# Patient Record
Sex: Male | Born: 1990 | Race: White | Hispanic: No | Marital: Single | Smoking: Never smoker
Health system: Southern US, Community
[De-identification: ages and names within clinical notes are randomized; demographics above are authoritative.]

## PROBLEM LIST (undated history)

## (undated) DIAGNOSIS — F840 Autistic disorder: Secondary | ICD-10-CM

---

## 2020-02-28 ENCOUNTER — Emergency Department (HOSPITAL_COMMUNITY)
Admission: EM | Admit: 2020-02-28 | Discharge: 2020-02-28 | Disposition: A | Discharge status: Home / Self Care | Payer: Medicaid Other | Attending: Emergency Medicine | Admitting: Emergency Medicine

## 2020-02-28 ENCOUNTER — Other Ambulatory Visit: Payer: Self-pay

## 2020-02-28 ENCOUNTER — Encounter (HOSPITAL_COMMUNITY): Payer: Self-pay

## 2020-02-28 ENCOUNTER — Emergency Department (HOSPITAL_COMMUNITY): Payer: Medicaid Other

## 2020-02-28 DIAGNOSIS — Y999 Unspecified external cause status: Secondary | ICD-10-CM | POA: Diagnosis not present

## 2020-02-28 DIAGNOSIS — Y92199 Unspecified place in other specified residential institution as the place of occurrence of the external cause: Secondary | ICD-10-CM | POA: Diagnosis not present

## 2020-02-28 DIAGNOSIS — W19XXXA Unspecified fall, initial encounter: Secondary | ICD-10-CM | POA: Diagnosis not present

## 2020-02-28 DIAGNOSIS — R519 Headache, unspecified: Secondary | ICD-10-CM | POA: Insufficient documentation

## 2020-02-28 DIAGNOSIS — Z23 Encounter for immunization: Secondary | ICD-10-CM | POA: Diagnosis not present

## 2020-02-28 DIAGNOSIS — S0181XA Laceration without foreign body of other part of head, initial encounter: Secondary | ICD-10-CM

## 2020-02-28 DIAGNOSIS — F840 Autistic disorder: Secondary | ICD-10-CM | POA: Insufficient documentation

## 2020-02-28 DIAGNOSIS — Y939 Activity, unspecified: Secondary | ICD-10-CM | POA: Insufficient documentation

## 2020-02-28 DIAGNOSIS — S01111A Laceration without foreign body of right eyelid and periocular area, initial encounter: Secondary | ICD-10-CM | POA: Insufficient documentation

## 2020-02-28 HISTORY — DX: Autistic disorder: F84.0

## 2020-02-28 MED ORDER — TETANUS-DIPHTH-ACELL PERTUSSIS 5-2.5-18.5 LF-MCG/0.5 IM SUSP
0.5000 mL | Freq: Once | INTRAMUSCULAR | Status: AC
  Administered 2020-02-28: 0.5 mL via INTRAMUSCULAR
  Filled 2020-02-28: qty 0.5

## 2020-02-28 NOTE — ED Notes (Signed)
Rt eyebrow laceration closed with Dermabond. Pt tol well

## 2020-02-28 NOTE — Discharge Instructions (Signed)
Please follow up with your primary care provider within 5-7 days for re-evaluation of your symptoms. If you do not have a primary care provider, information for a healthcare clinic has been provided for you to make arrangements for follow up care. Please return to the emergency department for any new or worsening symptoms. ° °

## 2020-02-28 NOTE — ED Triage Notes (Signed)
Patient is from a group home and staff member is with the patient.  Patient has a laceration to the right eyebrow area. Patient states he fell. Bleeding controlled.

## 2020-02-28 NOTE — ED Provider Notes (Signed)
[facility] Provider Note   CSN: [id number] Arrival date & time: 02/28/20  1556     History Chief Complaint  Patient presents with  . Laceration    Joseph Dilling Gorsline Montez Hageman. is a 29 y.o. male.  HPI   29 year old male with history of autism presenting for evaluation of fall.  He is here with his caregiver.  He lives in a group home.  He reportedly fell earlier today and sustained a cut to the right lateral eyebrow.  He is unsure how he fell.  He has some lower back pain but otherwise does not have any complaints.  The caregiver is not sure if his Tdap is up-to-date.  She denies that he has had any other complaints.  Past Medical History:  Diagnosis Date  . Autism     There are no problems to display for this patient.   History reviewed. No pertinent surgical history.     History reviewed. No pertinent family history.  Social History   Tobacco Use  . Smoking status: Never Smoker  . Smokeless tobacco: Never Used  Substance Use Topics  . Alcohol use: Never  . Drug use: Never    Home Medications Prior to Admission medications   Not on File    Allergies    Patient has no known allergies.  Review of Systems   Review of Systems  Constitutional: Negative for fever.  Eyes: Negative for photophobia, pain, redness and visual disturbance.  Respiratory: Negative for shortness of breath.   Gastrointestinal: Negative for abdominal pain, nausea and vomiting.  Genitourinary: Negative for flank pain.  Musculoskeletal: Positive for back pain. Negative for neck pain.  Skin: Positive for wound.  Neurological: Negative for weakness, numbness and headaches.       Head injury, no headache    Physical Exam Updated Vital Signs BP 111/71 (BP Location: Right Arm)   Pulse 63   Temp 97.8 F (36.6 C) (Oral)   Resp 16   Ht 5\' 7"  (1.702 m)   Wt 59 kg   SpO2 100%   BMI 20.36 kg/m   Physical Exam Vitals and nursing note reviewed.    Constitutional:      Appearance: He is well-developed.  HENT:     Head: Normocephalic.     Comments: 1cm laceration to the right eyebrow that is mildly ttp.  Eyes:     Extraocular Movements: Extraocular movements intact.     Conjunctiva/sclera: Conjunctivae normal.     Pupils: Pupils are equal, round, and reactive to light.     Comments: No nystagmus  Cardiovascular:     Rate and Rhythm: Normal rate and regular rhythm.     Heart sounds: No murmur.  Pulmonary:     Effort: Pulmonary effort is normal. No respiratory distress.     Breath sounds: Normal breath sounds.  Abdominal:     Palpations: Abdomen is soft.     Tenderness: There is no abdominal tenderness.  Musculoskeletal:     Cervical back: Neck supple.  Skin:    General: Skin is warm and dry.  Neurological:     Mental Status: He is alert.     Comments: Mental Status:  Alert, thought content appropriate, able to give a coherent history. Speech fluent without evidence of aphasia. Able to follow 2 step commands without difficulty.  Cranial Nerves:  II:  pupils equal, round, reactive to light III,IV, VI: ptosis not present, extra-ocular motions intact bilaterally  V,VII: smile symmetric, facial light  touch sensation equal VIII: hearing grossly normal to voice  X: uvula elevates symmetrically  XI: bilateral shoulder shrug symmetric and strong XII: midline tongue extension without fassiculations Motor:  Normal tone. 5/5 strength of BUE and BLE major muscle groups including strong and equal grip strength and dorsiflexion/plantar flexion Sensory: light touch normal in all extremities. Gait: normal gait and balance.      ED Results / Procedures / Treatments   Labs (all labs ordered are listed, but only abnormal results are displayed) Labs Reviewed - No data to display  EKG None  Radiology DG Lumbar Spine Complete  Result Date: 02/28/2020 CLINICAL DATA:  Fall EXAM: LUMBAR SPINE - COMPLETE 4+ VIEW COMPARISON:  None.  FINDINGS: There is no evidence of lumbar spine fracture. Alignment is normal. Intervertebral disc spaces are maintained. IMPRESSION: No fracture or dislocation of the lumbar spine Electronically Signed   By: Lauralyn Primes M.D.   On: 02/28/2020 18:09   CT Head Wo Contrast  Result Date: 02/28/2020 CLINICAL DATA:  Fall, headache EXAM: CT HEAD WITHOUT CONTRAST TECHNIQUE: Contiguous axial images were obtained from the base of the skull through the vertex without intravenous contrast. COMPARISON:  None. FINDINGS: Brain: No evidence of acute infarction, hemorrhage, hydrocephalus, extra-axial collection or mass lesion/mass effect. Vascular: No hyperdense vessel or unexpected calcification. Skull: Normal. Negative for fracture or focal lesion. Sinuses/Orbits: No acute finding. Other: None. IMPRESSION: No acute intracranial pathology. Electronically Signed   By: Lauralyn Primes M.D.   On: 02/28/2020 18:13    Procedures .Marland KitchenLaceration Repair  Date/Time: 02/28/2020 7:29 PM Performed by: Joseph Meres, PA-C Authorized by: Joseph Meres, PA-C   Consent:    Consent obtained:  Verbal   Consent given by:  Patient and guardian   Risks discussed:  Pain, infection and poor cosmetic result   Alternatives discussed:  No treatment Anesthesia (see MAR for exact dosages):    Anesthesia method:  None Laceration details:    Location: right lateral eyebrow.   Length (cm):  1 Repair type:    Repair type:  Simple Pre-procedure details:    Preparation:  Patient was prepped and draped in usual sterile fashion Exploration:    Hemostasis achieved with:  Direct pressure   Wound exploration: wound explored through full range of motion and entire depth of wound probed and visualized     Contaminated: no   Treatment:    Area cleansed with:  Saline   Amount of cleaning:  Standard   Irrigation solution:  Sterile saline   Visualized foreign bodies/material removed: no   Skin repair:    Repair method:  Tissue  adhesive Approximation:    Approximation:  Close Post-procedure details:    Dressing:  Open (no dressing)   Patient tolerance of procedure:  Tolerated well, no immediate complications   (including critical care time)  Medications Ordered in ED Medications  Tdap (BOOSTRIX) injection 0.5 mL (0.5 mLs Intramuscular Given 02/28/20 1741)    ED Course  I have reviewed the triage vital signs and the nursing notes.  Pertinent labs & imaging results that were available during my care of the patient were reviewed by me and considered in my medical decision making (see chart for details).    MDM Rules/Calculators/A&P                      Pressure irrigation performed. Wound explored and base of wound visualized in a bloodless field without evidence of foreign body.  Laceration occurred <  8 hours prior to repair which was well tolerated.Tdap updated.  Pt has  no comorbidities to effect normal wound healing. Pt discharged  without antibiotics.  CT head obtained and was without evidence of intracranial bleeding or skull fx. Xray lumbar spine without evidence of fracture. Advised pcp f/u; they are to return to the ED sooner for signs of infection. Pt is hemodynamically stable with no complaints prior to dc.    Final Clinical Impression(s) / ED Diagnoses Final diagnoses:  Facial laceration, initial encounter  Fall, initial encounter    Rx / DC Orders ED Discharge Orders    None       Bishop Dublin 02/28/20 1931    Fredia Sorrow, MD 03/09/20 [id number]

## 2020-04-27 ENCOUNTER — Other Ambulatory Visit: Payer: Self-pay

## 2020-04-27 ENCOUNTER — Emergency Department (HOSPITAL_COMMUNITY)
Admission: EM | Admit: 2020-04-27 | Discharge: 2020-04-27 | Disposition: A | Discharge status: Home / Self Care | Payer: Medicaid Other | Attending: Emergency Medicine | Admitting: Emergency Medicine

## 2020-04-27 ENCOUNTER — Encounter (HOSPITAL_COMMUNITY): Payer: Self-pay

## 2020-04-27 DIAGNOSIS — S0181XA Laceration without foreign body of other part of head, initial encounter: Secondary | ICD-10-CM | POA: Diagnosis not present

## 2020-04-27 DIAGNOSIS — Y9389 Activity, other specified: Secondary | ICD-10-CM | POA: Insufficient documentation

## 2020-04-27 DIAGNOSIS — W1830XA Fall on same level, unspecified, initial encounter: Secondary | ICD-10-CM | POA: Insufficient documentation

## 2020-04-27 DIAGNOSIS — Y998 Other external cause status: Secondary | ICD-10-CM | POA: Diagnosis not present

## 2020-04-27 DIAGNOSIS — Y92009 Unspecified place in unspecified non-institutional (private) residence as the place of occurrence of the external cause: Secondary | ICD-10-CM | POA: Diagnosis not present

## 2020-04-27 MED ORDER — LIDOCAINE HCL (PF) 1 % IJ SOLN
30.0000 mL | Freq: Once | INTRAMUSCULAR | Status: AC
  Administered 2020-04-27: 30 mL
  Filled 2020-04-27: qty 30

## 2020-04-27 MED ORDER — LIDOCAINE-EPINEPHRINE-TETRACAINE (LET) TOPICAL GEL
3.0000 mL | Freq: Once | TOPICAL | Status: AC
  Administered 2020-04-27: 3 mL via TOPICAL
  Filled 2020-04-27: qty 3

## 2020-04-27 NOTE — ED Notes (Signed)
Discharge instructions reviewed with caregiver from group home. Caregiver will be taking patient home. Pt and caregiver have no further questions at this time.

## 2020-04-27 NOTE — ED Provider Notes (Signed)
[facility] DEPT Provider Note   CSN: [id number] Arrival date & time: 04/27/20  7902     History Chief Complaint  Patient presents with  . Fall    Joseph Knight. is a 29 y.o. male with a past medical history of autism presenting to the ED after a fall that occurred prior to arrival. Patient unsure of circumstances regarding his fall. He reports pain only at the site of his chin laceration. He is unsure of last tetanus shot.  HPI     Past Medical History:  Diagnosis Date  . Autism     There are no problems to display for this patient.   History reviewed. No pertinent surgical history.     No family history on file.  Social History   Tobacco Use  . Smoking status: Never Smoker  . Smokeless tobacco: Never Used  Vaping Use  . Vaping Use: Never used  Substance Use Topics  . Alcohol use: Never  . Drug use: Never    Home Medications Prior to Admission medications   Not on File    Allergies    Patient has no known allergies.  Review of Systems   Review of Systems  Unable to perform ROS: Other (autism)  Eyes: Negative for visual disturbance.  Skin: Positive for wound.  Neurological: Negative for headaches.    Physical Exam Updated Vital Signs BP (!) 151/91 (BP Location: Right Arm)   Pulse 96   Temp 97.9 F (36.6 C) (Oral)   Resp 17   SpO2 100%   Physical Exam Vitals and nursing note reviewed.  Constitutional:      General: He is not in acute distress.    Appearance: He is well-developed.  HENT:     Head: Normocephalic and atraumatic.     Nose: Nose normal.  Eyes:     General: No scleral icterus.       Right eye: No discharge.        Left eye: No discharge.     Conjunctiva/sclera: Conjunctivae normal.     Pupils: Pupils are equal, round, and reactive to light.  Cardiovascular:     Rate and Rhythm: Normal rate and regular rhythm.     Heart sounds: Normal heart sounds. No murmur heard.  No friction rub.  No gallop.   Pulmonary:     Effort: Pulmonary effort is normal. No respiratory distress.     Breath sounds: Normal breath sounds.  Abdominal:     General: Bowel sounds are normal. There is no distension.     Palpations: Abdomen is soft.     Tenderness: There is no abdominal tenderness. There is no guarding.  Musculoskeletal:        General: Normal range of motion.     Cervical back: Normal range of motion and neck supple.  Skin:    General: Skin is warm and dry.     Findings: Laceration present. No rash.     Comments: 1in laceration to R chin that is somewhat gaping. This does not appear to be through and through. No internal mouth trauma noted.  Neurological:     Mental Status: He is alert.     Motor: No abnormal muscle tone.     Coordination: Coordination normal.     Comments: No facial asymmetry noted.  Equal grip strength bilaterally.     ED Results / Procedures / Treatments   Labs (all labs ordered are listed, but only abnormal results are displayed)  Labs Reviewed - No data to display  EKG None  Radiology No results found.  Procedures Procedures (including critical care time)  Medications Ordered in ED Medications  lidocaine (PF) (XYLOCAINE) 1 % injection 30 mL (30 mLs Infiltration Given 04/27/20 0739)  lidocaine-EPINEPHrine-tetracaine (LET) topical gel (3 mLs Topical Given 04/27/20 0739)    ED Course  I have reviewed the triage vital signs and the nursing notes.  Pertinent labs & imaging results that were available during my care of the patient were reviewed by me and considered in my medical decision making (see chart for details).  Clinical Course as of Apr 27 833  Mon Apr 27, 2020  8657 Attempted to contact patient's father listed as emergency contact, without success.   [HK]  0739 Attempted to call group home. They state everyone that knew about the fall went home "because they were on third shift."   [HK]    Clinical Course User Index [HK] Dietrich Pates, PA-C   MDM Rules/Calculators/A&P                          29 year old male with a past medical history of autism presenting to the ED for chin laceration after fall that occurred prior to arrival.  Posey Rea of circumstances surrounding his fall but he appears to be at his baseline.  Wound explored and base of wound visualized in a bloodless field without evidence of foreign body.  Chart review shows that Tdap was updated several weeks ago. Patient counseled on wound care. Patient counseled on need to return or see PCP/urgent care for suture removal in 5 days. Patient was urged to return to the Emergency Department urgently with worsening pain, swelling, expanding erythema especially if it streaks away from the affected area, fever, or if they have any other concerns. Patient verbalized understanding.    Patient is hemodynamically stable, in NAD, and able to ambulate in the ED. Evaluation does not show pathology that would require ongoing emergent intervention or inpatient treatment. I explained the diagnosis to the patient. Pain has been managed and has no complaints prior to discharge. Patient is comfortable with above plan and is stable for discharge at this time. All questions were answered prior to disposition. Strict return precautions for returning to the ED were discussed. Encouraged follow up with PCP.   An After Visit Summary was printed and given to the patient.   Portions of this note were generated with Scientist, clinical (histocompatibility and immunogenetics). Dictation errors may occur despite best attempts at proofreading.   Final Clinical Impression(s) / ED Diagnoses Final diagnoses:  Facial laceration, initial encounter    Rx / DC Orders ED Discharge Orders    None       Dietrich Pates, PA-C 04/27/20 8469    Tegeler, Canary Brim, MD 04/27/20 [phone]

## 2020-04-27 NOTE — ED Triage Notes (Signed)
Patient arrived due to a fall today with an inch laceration on his chin. No known LOC. Patient lives at group home [facility] [phone]) with minimal information given to EMS.

## 2020-04-27 NOTE — Discharge Instructions (Addendum)
Follow instructions regarding wound care. You will need to return in about 5 days for suture removal. Follow-up with your primary care provider. Return to the ER for signs of infection including redness, swelling, pus draining from the area or fevers.

## 2021-09-27 ENCOUNTER — Encounter (HOSPITAL_COMMUNITY): Payer: Self-pay

## 2021-09-27 ENCOUNTER — Other Ambulatory Visit: Payer: Self-pay

## 2021-09-27 ENCOUNTER — Emergency Department (HOSPITAL_COMMUNITY): Payer: Medicaid Other

## 2021-09-27 ENCOUNTER — Emergency Department (HOSPITAL_COMMUNITY)
Admission: EM | Admit: 2021-09-27 | Discharge: 2021-09-27 | Disposition: A | Discharge status: Home / Self Care | Payer: Medicaid Other | Attending: Emergency Medicine | Admitting: Emergency Medicine

## 2021-09-27 DIAGNOSIS — R197 Diarrhea, unspecified: Secondary | ICD-10-CM | POA: Insufficient documentation

## 2021-09-27 DIAGNOSIS — M79604 Pain in right leg: Secondary | ICD-10-CM | POA: Diagnosis present

## 2021-09-27 DIAGNOSIS — F840 Autistic disorder: Secondary | ICD-10-CM | POA: Diagnosis not present

## 2021-09-27 NOTE — ED Triage Notes (Signed)
EMS reports from [facility] group home, Father took Pt home 2 days ago and returned to group home yesterday, staff noticed right leg limp and diarrhea since returning. Hx of Autism.  BP 121/67 HR 65 RR 16 Sp02 100 RA

## 2021-09-27 NOTE — ED Provider Notes (Signed)
[facility]EM[facility]GENCY [facility]PT Provider Note   CSN: [id number] Arrival date & time: 09/27/21  1156     History Chief Complaint  Patient presents with   Leg Pain   Diarrhea    Jerene Dilling Burgin Montez Hageman. is a 30 y.o. male.  Patient noted to have recent episode of diarrhea, and appeared to be favoring right leg, ?limp. Patient very limited historian, hx autism - level 5 caveat. No report of trauma/fall. No report of fevers. No vomiting.   The history is provided by the patient, medical records, the EMS personnel and a caregiver. The history is limited by the condition of the patient.  Leg Pain Diarrhea     Past Medical History:  Diagnosis Date   Autism     There are no problems to display for this patient.   History reviewed. No pertinent surgical history.     History reviewed. No pertinent family history.  Social History   Tobacco Use   Smoking status: Never   Smokeless tobacco: Never  Vaping Use   Vaping Use: Never used  Substance Use Topics   Alcohol use: Never   Drug use: Never    Home Medications Prior to Admission medications   Not on File    Allergies    Patient has no known allergies.  Review of Systems   Review of Systems  Unable to perform ROS: Other  Gastrointestinal:  Positive for diarrhea.  Hx autism - level 5 caveat - not participatory in history/ros   Physical Exam Updated Vital Signs BP 118/73   Pulse 66   Temp 98 F (36.7 C) (Oral)   Resp 16   SpO2 100%   Physical Exam Vitals and nursing note reviewed.  Constitutional:      Appearance: Normal appearance. He is well-developed.  HENT:     Head: Atraumatic.     Nose: Nose normal.     Mouth/Throat:     Mouth: Mucous membranes are moist.     Pharynx: Oropharynx is clear.  Eyes:     General: No scleral icterus.    Conjunctiva/sclera: Conjunctivae normal.     Pupils: Pupils are equal, round, and reactive to light.  Neck:     Trachea: No tracheal deviation.   Cardiovascular:     Rate and Rhythm: Normal rate and regular rhythm.     Pulses: Normal pulses.     Heart sounds: Normal heart sounds. No murmur heard.   No friction rub. No gallop.  Pulmonary:     Effort: Pulmonary effort is normal. No accessory muscle usage or respiratory distress.     Breath sounds: Normal breath sounds.  Abdominal:     General: Bowel sounds are normal. There is no distension.     Palpations: Abdomen is soft.     Tenderness: There is no abdominal tenderness. There is no guarding.  Genitourinary:    Comments: No cva tenderness. Musculoskeletal:        General: No swelling.     Cervical back: Normal range of motion and neck supple. No rigidity.     Comments: ?v mild swelling right proximal foot. Distal pulses palp. RLE of normal color and warmth, no focal bony tenderness. Good rom at hip, knee and ankle without pain. No swelling of leg. No sign of infection.   Skin:    General: Skin is warm and dry.     Findings: No rash.  Neurological:     Mental Status: He is alert.  Comments: Alert, speech clear.   Psychiatric:        Mood and Affect: Mood normal.    ED Results / Procedures / Treatments   Labs (all labs ordered are listed, but only abnormal results are displayed) No results found for this or any previous visit. DG Tibia/Fibula Right  Result Date: 09/27/2021 CLINICAL DATA:  Right leg limp EXAM: RIGHT ANKLE - COMPLETE 3+ VIEW; RIGHT TIBIA AND FIBULA - 2 VIEW COMPARISON:  None. FINDINGS: Tibia/fibula: There is no acute fracture or dislocation. Bony alignment is normal. The soft tissues are unremarkable. Ankle: There is no acute fracture or dislocation. Bony alignment is normal. The ankle mortise is intact. The soft tissues are unremarkable. IMPRESSION: No acute fracture or dislocation of the tibia/fibula or ankle. Electronically Signed   By: Lesia Hausen M.D.   On: 09/27/2021 13:23   DG Ankle Complete Right  Result Date: 09/27/2021 CLINICAL DATA:  Right  leg limp EXAM: RIGHT ANKLE - COMPLETE 3+ VIEW; RIGHT TIBIA AND FIBULA - 2 VIEW COMPARISON:  None. FINDINGS: Tibia/fibula: There is no acute fracture or dislocation. Bony alignment is normal. The soft tissues are unremarkable. Ankle: There is no acute fracture or dislocation. Bony alignment is normal. The ankle mortise is intact. The soft tissues are unremarkable. IMPRESSION: No acute fracture or dislocation of the tibia/fibula or ankle. Electronically Signed   By: Lesia Hausen M.D.   On: 09/27/2021 13:23   DG Foot Complete Right  Result Date: 09/27/2021 CLINICAL DATA:  Right leg limp. EXAM: RIGHT FOOT COMPLETE - 3+ VIEW COMPARISON:  Right foot radiographs 07/23/2018 FINDINGS: There is no evidence of fracture or dislocation. There is no evidence of arthropathy or other focal bone abnormality. Soft tissues are unremarkable. IMPRESSION: Negative. Electronically Signed   By: Marin Roberts M.D.   On: 09/27/2021 13:22   DG HIP UNILAT W OR W/O PELVIS 2-3 VIEWS RIGHT  Result Date: 09/27/2021 CLINICAL DATA:  Limping EXAM: DG HIP (WITH OR WITHOUT PELVIS) 2-3V RIGHT COMPARISON:  None. FINDINGS: No acute fracture or dislocation. Mild joint space narrowing of both hips. Small areas of relative lucency with peripheral sclerosis in the bilateral femoral heads. Decreased offset at the lateral femoral head-neck junction on the right. Bony pelvis intact. No soft tissue abnormality. IMPRESSION: 1. No acute osseous abnormality. 2. Mild bilateral hip joint space narrowing. 3. Areas of relative lucency with peripheral sclerosis in the bilateral femoral heads, potentially representing sites of avascular necrosis or possibly bone infarcts. Consider further characterization with MRI of the pelvis, on a nonemergent basis. Electronically Signed   By: Duanne Guess D.O.   On: 09/27/2021 13:27    EKG None  Radiology No results found.  Procedures Procedures   Medications Ordered in ED Medications - No data to  display  ED Course  I have reviewed the triage vital signs and the nursing notes.  Pertinent labs & imaging results that were available during my care of the patient were reviewed by me and considered in my medical decision making (see chart for details).    MDM Rules/Calculators/A&P                           Imaging ordered.   Reviewed nursing notes and prior charts for additional history.   Po fluids/food.   Xrays reviewed/interpreted by me - no fx. ?sclerosis fem heads.   No diarrhea while in ED. No vomiting. Normal vitals.   Pt currently appears stable  for d/c.    Final Clinical Impression(s) / ED Diagnoses Final diagnoses:  None    Rx / DC Orders ED Discharge Orders     None        Cathren Laine, MD 09/27/21 1333

## 2021-09-27 NOTE — Discharge Instructions (Addendum)
It was our pleasure to provide your ER care today - we hope that you feel better.  Your xrays show no acute fracture. Incidental note was made of:  1. No acute osseous abnormality. 2. Mild bilateral hip joint space narrowing. 3. Areas of relative lucency with peripheral sclerosis in the bilateral femoral heads, potentially representing sites of avascular necrosis or possibly bone infarcts. Consider further  characterization with MRI of the pelvis, on a nonemergent basis.   Follow up with primary care doctor in the next 1-2 weeks - discuss above imaging results with them, potential further outpatient imaging per your doctor.   Return to ER if worse, new symptoms, fevers, new/severe pain, or other concern.

## 2022-01-28 ENCOUNTER — Ambulatory Visit: Payer: Medicaid Other | Attending: Orthopedic Surgery

## 2022-01-28 ENCOUNTER — Other Ambulatory Visit: Payer: Self-pay

## 2022-01-28 DIAGNOSIS — R293 Abnormal posture: Secondary | ICD-10-CM | POA: Diagnosis present

## 2022-01-28 DIAGNOSIS — R2689 Other abnormalities of gait and mobility: Secondary | ICD-10-CM | POA: Diagnosis present

## 2022-01-28 DIAGNOSIS — M6281 Muscle weakness (generalized): Secondary | ICD-10-CM | POA: Diagnosis present

## 2022-01-28 NOTE — Therapy (Signed)
?OUTPATIENT PHYSICAL THERAPY NEURO EVALUATION ? ? ?Patient Name: Joseph NatalBrian Scott Varghese Jr. ?MRN: [id number] ?DOB:08/07/1991, 31 y.o., male ?Today's Date: 01/29/2022 ? ?PCP: Lucretia Fieldoyals, Hoover M ?REFERRING PROVIDER: Joen LauraMarchwiany, Daniel A, MD ? ? PT End of Session - 01/28/22 1401   ? ? Visit Number 1   ? Number of Visits 4   ? Date for PT Re-Evaluation 02/18/22   ? Authorization Type medicaid, auth requested   ? PT Start Time 1400   ? PT Stop Time 1443   ? PT Time Calculation (min) 43 min   ? Activity Tolerance Patient tolerated treatment well   ? ?  ?  ? ?  ? ? ?Past Medical History:  ?Diagnosis Date  ? Autism   ? ?History reviewed. No pertinent surgical history. ?There are no problems to display for this patient. ?Medication lists were scanned in chart. ? ?ONSET DATE: 01/07/22 ? ?REFERRING DIAG: cerebral palsy ? ?THERAPY DIAG:  ?Other abnormalities of gait and mobility - Plan: PT plan of care cert/re-cert ? ?Muscle weakness (generalized) - Plan: PT plan of care cert/re-cert ? ?Abnormal posture - Plan: PT plan of care cert/re-cert ? ?SUBJECTIVE:  ?                                                                                                                                                                                           ? ?SUBJECTIVE STATEMENT: ?Pt's caregiver reports that his dad wanted his legs to get checked out due to pain and swelling in his legs. Pt lives in a group home. His caregiver with the group home, Joseph Knight, who is present said they have not noticed a big change but said father voiced concerns about a month ago. Pt has been to ED about this and seen vascular doctor who said that blood flow was good in legs. Pt does go to a Day program M-F from 10-2. Do not have any exercise equipment there. It is more on educational side. [place] the [place] so [place] walk.  ?Pt accompanied by:  caregiver, Joseph Knight, from group home ? ?PERTINENT HISTORY: cerebral palsy, seizures (but none recently per caregiver), Raynauds, per  caregiver pt has scars on both thighs from a prior surgery but unable to find anything about it in chart ? ?PAIN:  ?Are you having pain? Pt has difficulty answering question. Initially stated no then pointed to lower back area. Also pointed to feet when shoes were removed saying "hurt" but no outwards signs of pain. ? ?PRECAUTIONS: Fall ? ?WEIGHT BEARING RESTRICTIONS No ? ?FALLS: Has patient fallen in last 6 months? No, Number of falls: 0 ? ?LIVING ENVIRONMENT: ?Lives  with: lives in an adult home ?Lives in: Group home ?Stairs: No;  ?Has following equipment at home: None ? ?PLOF: Needs assistance with ADLs, caregiver reports that he can put his clothes on himself, feeds himself with adaptive spoon but regular fork and knife. Needs prompting to remember to use bathroom or has accidents. Walks without AD. ? ?PATIENT GOALS Per caregiver pt's family would like to help some with "pain/swelling" in legs. ? ?OBJECTIVE:  ? ?DIAGNOSTIC FINDINGS: DG HIP (WITH OR WITHOUT PELVIS) 2-3V RIGHT IMPRESSION: ?1. No acute osseous abnormality. ?2. Mild bilateral hip joint space narrowing. ?3. Areas of relative lucency with peripheral sclerosis in the ?bilateral femoral heads, potentially representing sites of avascular ?necrosis or possibly bone infarcts. Consider further ?characterization with MRI of the pelvis, on a nonemergent basis. ? ?COGNITION: ?Overall cognitive status: Impaired: autisim with intellectual impairment ?  ?SENSATION: ?Unable to formally assess due to cognition ? ? ?EDEMA:  ?Non noted ? ?Observation: Pt has some purple hue to feet but noted good pedal pulses. Has Raynaud diagnosis. ? ?MUSCLE LENGTH: ?Hamstrings: Right 44 deg; Left 36 deg ? ?POSTURE:  crouched posture in standing ? ?LE ROM:    ? ?Passive  Right ?01/29/2022 Left ?01/29/2022  ?Hip flexion    ?Hip extension    ?Hip abduction    ?Hip adduction    ?Hip internal rotation 31 35  ?Hip external rotation 15 10  ?Knee flexion    ?Knee extension    ?Ankle  dorsiflexion Past neutral Past neutral  ?Ankle plantarflexion    ?Ankle inversion    ?Ankle eversion    ? (Blank rows = not tested) ? ?MMT:   ? ?MMT Right ?01/29/2022 Left ?01/29/2022  ?Hip flexion 3+/5 4/5  ?Hip extension    ?Hip abduction    ?Hip adduction    ?Hip internal rotation    ?Hip external rotation    ?Knee flexion 4/5 4/5  ?Knee extension 4/5 4/5  ?Ankle dorsiflexion 4/5 4/5  ?Ankle plantarflexion    ?Ankle inversion    ?Ankle eversion    ?(Blank rows = not tested) ? ?BED MOBILITY:  ?Sit to supine Complete Independence ?Supine to sit Complete Independence ? ?TRANSFERS: ? ?Sit to stand: SBA able to rise without hands with cuing but uses hands normally ?Stand to sit: SBA, decreased eccentric control ? ? ? ?GAIT: ?Gait pattern: step through pattern, knee flexed in stance- Right, and knee flexed in stance- Left, pt has crouched gait pattern ?Distance walked: 100 ?Assistive device utilized: None ?Level of assistance: SBA ?Comments:  ? ?FUNCTIONAL TESTs:  ?10 meter walk test: 8.74 sec= 1.62m/s ?Pt able to donn/doff socks and shoes lifting leg up to reach foot. ? ? ? ?TODAY'S TREATMENT:  ? ? ?PATIENT EDUCATION: ?Education details: PT poc ?Person educated: Patient and Engineer, structural. PT called and left message for pt's father Joseph Knight to further discuss any concerns he has. ?Education method: Explanation ?Education comprehension: verbalized understanding ? ? ?HOME EXERCISE PROGRAM: ? ? ? ? ?GOALS: ?Goals reviewed with patient? Yes ? ?SHORT TERM GOALS= LTGs ? ? ? ?LONG TERM GOALS: Target date:  02/18/22 ? ?Pt and caregiver will be instructed in stretching and strengthening HEP and walking program to work on at home to help pt be more active. ?Baseline: no current program ?Goal status: INITIAL ? ?2.  Pt will increase hamstring length by 5 degrees bilateral for improved mobility. ?Baseline: 01/28/22 right=44 and left=36 degrees ?Goal status: INITIAL ? ? ? ?ASSESSMENT: ? ?CLINICAL IMPRESSION: ?Patient is a 30  y.o. male who was  seen today for physical therapy evaluation and treatment for cerebral palsy. Caregiver reports family wanted to get his legs checked out as he has been having some pain/swelling in legs. Pt presents with strength decreased with 3+/5 at right hip flexion and grossly 4/5 in other lower extremity groups. Pt has decreased hamstring length bilateral. Pt is ambulating at safe community speed of 1.74m/s with crouched pattern. Has not had any reported falls. Pt will benefit from skilled PT to address flexibility, strength and functional mobility deficits with big focus of establishing HEP that caregivers can do with him. ? ? ?OBJECTIVE IMPAIRMENTS Abnormal gait, decreased balance, decreased cognition, decreased ROM, decreased strength, and impaired flexibility.  ? ?ACTIVITY LIMITATIONS community activity.  ? ?PERSONAL FACTORS Behavior pattern, Time since onset of injury/illness/exacerbation, and 3+ comorbidities: autism, seizures, Raynauds  are also affecting patient's functional outcome.  ? ? ?REHAB POTENTIAL: Fair   ? ?CLINICAL DECISION MAKING: Evolving/moderate complexity ? ?EVALUATION COMPLEXITY: Moderate ? ?PLAN: ?PT FREQUENCY: 1x/week ? ?PT DURATION: 3 weeks, plus eval ? ?PLANNED INTERVENTIONS: Therapeutic exercises, Therapeutic activity, Neuromuscular re-education, Balance training, Gait training, Patient/Family education, Joint mobilization, Stair training, and Manual therapy ? ?PLAN FOR NEXT SESSION: Establish HEP for stretching- hamstrings, hips ER and IR, functional strengthening (sit to stand, step-ups), walking program ? ? ?Ronn Melena, PT, DPT, NCS ?01/29/2022, 6:14 PM ? ? ? ? ? ?  ?

## 2022-02-04 ENCOUNTER — Ambulatory Visit: Payer: Medicaid Other

## 2022-02-10 ENCOUNTER — Ambulatory Visit: Payer: Medicaid Other

## 2022-02-17 ENCOUNTER — Ambulatory Visit: Payer: Medicaid Other | Attending: Family Medicine

## 2022-12-07 ENCOUNTER — Emergency Department (HOSPITAL_BASED_OUTPATIENT_CLINIC_OR_DEPARTMENT_OTHER)
Admission: EM | Admit: 2022-12-07 | Discharge: 2022-12-07 | Disposition: A | Discharge status: Home / Self Care | Payer: Medicaid Other | Attending: Emergency Medicine | Admitting: Emergency Medicine

## 2022-12-07 ENCOUNTER — Emergency Department (HOSPITAL_BASED_OUTPATIENT_CLINIC_OR_DEPARTMENT_OTHER): Payer: Medicaid Other

## 2022-12-07 ENCOUNTER — Other Ambulatory Visit: Payer: Self-pay

## 2022-12-07 ENCOUNTER — Encounter (HOSPITAL_BASED_OUTPATIENT_CLINIC_OR_DEPARTMENT_OTHER): Payer: Self-pay

## 2022-12-07 DIAGNOSIS — L03211 Cellulitis of face: Secondary | ICD-10-CM | POA: Insufficient documentation

## 2022-12-07 DIAGNOSIS — R220 Localized swelling, mass and lump, head: Secondary | ICD-10-CM | POA: Diagnosis present

## 2022-12-07 DIAGNOSIS — L0201 Cutaneous abscess of face: Secondary | ICD-10-CM | POA: Diagnosis not present

## 2022-12-07 LAB — BASIC METABOLIC PANEL
Anion gap: 10 (ref 5–15)
BUN: 17 mg/dL (ref 6–20)
CO2: 28 mmol/L (ref 22–32)
Calcium: 9.1 mg/dL (ref 8.9–10.3)
Chloride: 101 mmol/L (ref 98–111)
Creatinine, Ser: 0.76 mg/dL (ref 0.61–1.24)
GFR, Estimated: >60 mL/min (ref >60)
Glucose, Bld: 90 mg/dL (ref 70–99)
Potassium: 3.7 mmol/L (ref 3.5–5.1)
Sodium: 139 mmol/L (ref 135–145)

## 2022-12-07 LAB — CBC WITH DIFFERENTIAL/PLATELET
Abs Immature Granulocytes: 0.01 10*3/uL (ref 0.00–0.07)
Basophils Absolute: 0 10*3/uL (ref 0.0–0.1)
Basophils Relative: 0 %
Eosinophils Absolute: 0 10*3/uL (ref 0.0–0.5)
Eosinophils Relative: 0 %
HCT: 45.7 % (ref 39.0–52.0)
Hemoglobin: 16.1 g/dL (ref 13.0–17.0)
Immature Granulocytes: 0 %
Lymphocytes Relative: 31 %
Lymphs Abs: 1.4 10*3/uL (ref 0.7–4.0)
MCH: 32.2 pg (ref 26.0–34.0)
MCHC: 35.2 g/dL (ref 30.0–36.0)
MCV: 91.4 fL (ref 80.0–100.0)
Monocytes Absolute: 0.5 10*3/uL (ref 0.1–1.0)
Monocytes Relative: 11 %
Neutro Abs: 2.6 10*3/uL (ref 1.7–7.7)
Neutrophils Relative %: 58 %
Platelets: 132 10*3/uL — ABNORMAL LOW (ref 150–400)
RBC: 5 MIL/uL (ref 4.22–5.81)
RDW: 12.2 % (ref 11.5–15.5)
WBC: 4.6 10*3/uL (ref 4.0–10.5)
nRBC: 0 % (ref 0.0–0.2)

## 2022-12-07 MED ORDER — IOHEXOL 300 MG/ML  SOLN
80.0000 mL | Freq: Once | INTRAMUSCULAR | Status: AC | PRN
  Administered 2022-12-07: 80 mL via INTRAVENOUS

## 2022-12-07 MED ORDER — LIDOCAINE-EPINEPHRINE-TETRACAINE (LET) TOPICAL GEL
3.0000 mL | Freq: Once | TOPICAL | Status: AC
  Administered 2022-12-07: 3 mL via TOPICAL
  Filled 2022-12-07: qty 3

## 2022-12-07 MED ORDER — AMOXICILLIN-POT CLAVULANATE 875-125 MG PO TABS: 1.0000 | ORAL_TABLET | Freq: Two times a day (BID) | ORAL | 0 refills | Status: AC

## 2022-12-07 MED ORDER — DOXYCYCLINE HYCLATE 100 MG PO CAPS: 100.0000 mg | ORAL_CAPSULE | Freq: Two times a day (BID) | ORAL | 0 refills | Status: AC

## 2022-12-07 MED ORDER — ACETAMINOPHEN 160 MG/5ML PO SOLN
650.0000 mg | Freq: Once | ORAL | Status: AC
  Administered 2022-12-07: 650 mg via ORAL
  Filled 2022-12-07: qty 20.3

## 2022-12-07 MED ORDER — SODIUM CHLORIDE 0.9 % IV SOLN
3.0000 g | Freq: Once | INTRAVENOUS | Status: AC
  Administered 2022-12-07: 3 g via INTRAVENOUS

## 2022-12-07 MED ORDER — LORAZEPAM 1 MG PO TABS
1.0000 mg | ORAL_TABLET | Freq: Once | ORAL | Status: AC
  Administered 2022-12-07: 1 mg via ORAL
  Filled 2022-12-07: qty 1

## 2022-12-07 NOTE — ED Provider Notes (Signed)
[facility] Provider Note   CSN: [id number] Arrival date & time: 12/07/22  1420     History  Chief Complaint  Patient presents with   Facial Swelling    Joseph Dilling Fairclough Montez Hageman. is a 32 y.o. male, history of autism, intellectual disability, who presents to the ED secondary to right eye swelling, redness, and lesion next to his right eye around his right brow for the last 3 days.  Per caretaker, and nurse that I spoke to on the phone, the patient has had a bump on his right temple for the last couple days, and the redness spread to his eye, he was started on Keflex and doxycycline yesterday by his primary care doctor, but the eye became more red and swollen and difficult to see out of today so he came to the ER.  He denies any fevers.  History is limited given his intellectual disability.     Home Medications Prior to Admission medications   Medication Sig Start Date End Date Taking? Authorizing Provider  amoxicillin-clavulanate (AUGMENTIN) 875-125 MG tablet Take 1 tablet by mouth every 12 (twelve) hours. 12/07/22  Yes Aurianna Earlywine L, PA  doxycycline (VIBRAMYCIN) 100 MG capsule Take 1 capsule (100 mg total) by mouth 2 (two) times daily. 12/07/22  Yes Primrose Oler L, PA      Allergies    Patient has no known allergies.    Review of Systems   Review of Systems  HENT:  Negative for ear pain.   Eyes:  Positive for pain.  Skin:  Positive for rash.    Physical Exam Updated Vital Signs BP 128/79 (BP Location: Right Arm)   Pulse 76   Temp 97.8 F (36.6 C) (Oral)   Resp 16   Wt 65.7 kg   SpO2 100%   BMI 22.68 kg/m  Physical Exam Vitals and nursing note reviewed.  Constitutional:      General: He is not in acute distress.    Appearance: He is well-developed.  HENT:     Head: Normocephalic and atraumatic.     Right Ear: Tympanic membrane normal.     Left Ear: Tympanic membrane normal.  Eyes:     Extraocular Movements: Extraocular  movements intact.     Conjunctiva/sclera: Conjunctivae normal.     Comments: Pain with movement of right eye  Cardiovascular:     Rate and Rhythm: Normal rate and regular rhythm.     Heart sounds: No murmur heard. Pulmonary:     Effort: Pulmonary effort is normal. No respiratory distress.     Breath sounds: Normal breath sounds.  Abdominal:     Palpations: Abdomen is soft.     Tenderness: There is no abdominal tenderness.  Musculoskeletal:        General: No swelling.     Cervical back: Neck supple.  Skin:    General: Skin is warm and dry.     Capillary Refill: Capillary refill takes less than 2 seconds.     Comments: Edema and erythema of right orbit, with erythematous fluctuant mass on the right temple that is about 1.5 cm x 1.5 cm in nature.  Neurological:     Mental Status: He is alert.  Psychiatric:        Mood and Affect: Mood normal.        ED Results / Procedures / Treatments   Labs (all labs ordered are listed, but only abnormal results are displayed) Labs Reviewed  CBC  WITH DIFFERENTIAL/PLATELET - Abnormal; Notable for the following components:      Result Value   Platelets 132 (*)    All other components within normal limits  BASIC METABOLIC PANEL    EKG None  Radiology CT Orbits W Contrast  Result Date: 12/07/2022 CLINICAL DATA:  Bump in right temporal area with surrounding redness and swelling. EXAM: CT ORBITS WITH CONTRAST TECHNIQUE: Multidetector CT images was performed according to the standard protocol following intravenous contrast administration. RADIATION DOSE REDUCTION: This exam was performed according to the departmental dose-optimization program which includes automated exposure control, adjustment of the mA and/or kV according to patient size and/or use of iterative reconstruction technique. CONTRAST:  27mL OMNIPAQUE IOHEXOL 300 MG/ML  SOLN COMPARISON:  CT head 02/28/2020 FINDINGS: Orbits: Right: The globe is intact. The extraocular muscles are  normal. The orbital fat is normal. The optic nerve is normal. The lacrimal gland is normal. Left: The globe is intact. The extraocular muscles are normal. The orbital fat is normal. The optic nerve is normal. The lacrimal gland is normal. Visible paranasal sinuses: Clear. The mastoid air cells and middle ear cavities are clear. Soft tissues: There is skin and soft tissue thickening in the right temporal scalp with mild surrounding inflammatory fat stranding. There is no defined organized fluid collection. Osseous: There is no acute osseous abnormality or suspicious osseous lesion. Limited intracranial: The imaged intracranial compartment is unremarkable. IMPRESSION: Skin and soft tissue thickening in the right temporal scalp with mild surrounding inflammatory fat stranding suggestive of cellulitis. No defined organized fluid collection. Electronically Signed   By: Valetta Mole M.D.   On: 12/07/2022 18:46    Procedures .Marland KitchenIncision and Drainage  Date/Time: 12/07/2022 11:44 PM  Performed by: Osvaldo Shipper, PA Authorized by: Osvaldo Shipper, PA   Consent:    Consent obtained:  Verbal   Consent given by:  Healthcare agent   Risks, benefits, and alternatives were discussed: yes     Risks discussed:  Bleeding, incomplete drainage, pain, damage to other organs and infection   Alternatives discussed:  Alternative treatment Universal protocol:    Patient identity confirmed:  Arm band Location:    Type:  Abscess   Size:  1.5cm   Location:  Head   Head location:  Face Pre-procedure details:    Skin preparation:  Povidone-iodine Sedation:    Sedation type:  Anxiolysis Anesthesia:    Anesthesia method:  Topical application   Topical anesthetic:  LET Procedure type:    Complexity:  Simple Procedure details:    Incision types:  Stab incision   Drainage:  Bloody   Drainage amount:  Scant   Wound treatment:  Wound left open  Medications Ordered in ED Medications  acetaminophen (TYLENOL) 160  MG/5ML solution 650 mg (650 mg Oral Given 12/07/22 1703)  LORazepam (ATIVAN) tablet 1 mg (1 mg Oral Given 12/07/22 1703)  Ampicillin-Sulbactam (UNASYN) 3 g in sodium chloride 0.9 % 100 mL IVPB (0 g Intravenous Stopped 12/07/22 1735)  lidocaine-EPINEPHrine-tetracaine (LET) topical gel (3 mLs Topical Given 12/07/22 1742)  iohexol (OMNIPAQUE) 300 MG/ML solution 80 mL (80 mLs Intravenous Contrast Given 12/07/22 1756)    ED Course/ Medical Decision Making/ A&P  :1}                          Medical Decision Making Patient is a 32 year old male, here for a wound on the right side of his face, and worsening redness, swelling of his  face.  Denies any fevers, history limited given his intellectual disability.  He does state it is painful when he moves his right eye, given history is limited, and there is spread to his right eye, will obtain CT orbits, he also fluctuant mass on the right temple, we will attempt to I&D this.    Amount and/or Complexity of Data Reviewed Labs: ordered.    Details: Unremarkable Radiology: ordered.    Details: CT orbit, shows findings concerning for cellulitis no progression to the orbits or eye. Discussion of management or test interpretation with external provider(s): Discussed with caseworker, I&D performed prior to CT, unsuccessful, found to only have cellulitis, hemostasis was achieved after attempted I&D.  Patient tolerated well.  Discussed cellulitis, will start on doxycycline, and Augmentin for further control of symptoms, return precautions were emphasized, such as worsening redness, swelling, inability to open eye, vision changes, or worsening pain.  Risk OTC drugs. Prescription drug management.    Final Clinical Impression(s) / ED Diagnoses Final diagnoses:  Facial cellulitis    Rx / DC Orders ED Discharge Orders          Ordered    amoxicillin-clavulanate (AUGMENTIN) 875-125 MG tablet  Every 12 hours        12/07/22 1852    doxycycline (VIBRAMYCIN) 100 MG  capsule  2 times daily        12/07/22 1852              Joseph Knight, Si Gaul, PA 12/07/22 2347    Deno Etienne, DO 12/08/22 1203

## 2022-12-07 NOTE — Discharge Instructions (Addendum)
Please follow-up with your primary care doctor, if you develop any changes in vision, eye pain please return to the ER.  Take the antibiotics as prescribed, it should be improving within the next 48 hours.  If the swelling redness is worsening please return to the ER.

## 2022-12-07 NOTE — ED Triage Notes (Signed)
Pt brought in by caregiver. Reports pt had been scratching bump on right temple area. Area is red and swollen x 3 days

## 2023-02-28 IMAGING — CR DG HIP (WITH OR WITHOUT PELVIS) 2-3V*R*
3 series · 3 of 3 positions shown · non-contrast
Comparison: None.

CLINICAL DATA: Limping

EXAM:
DG HIP (WITH OR WITHOUT PELVIS) 2-3V RIGHT

[x pelvis]
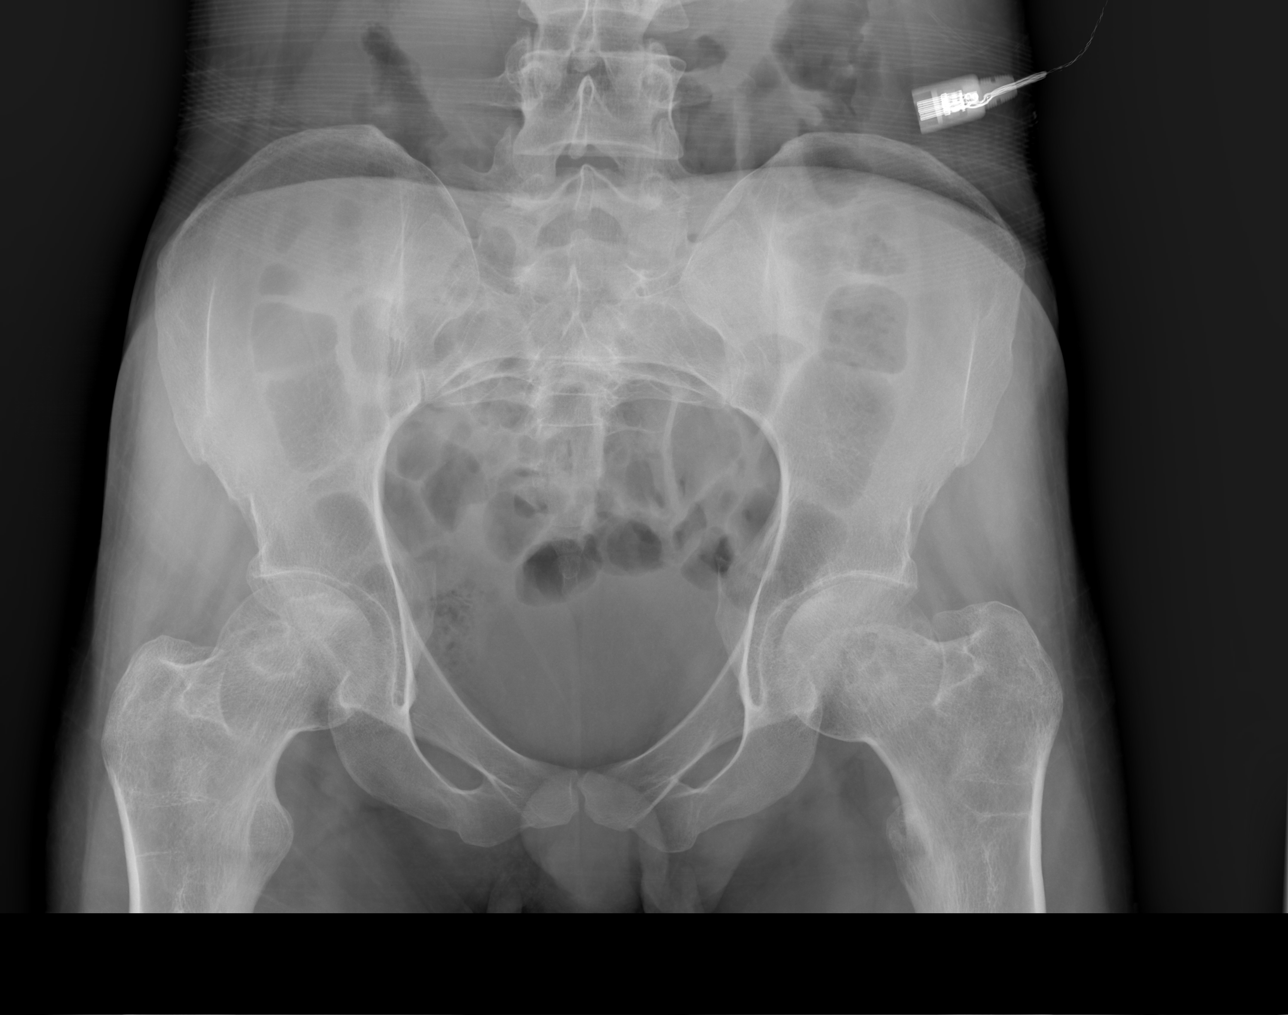

[x hip ap right]
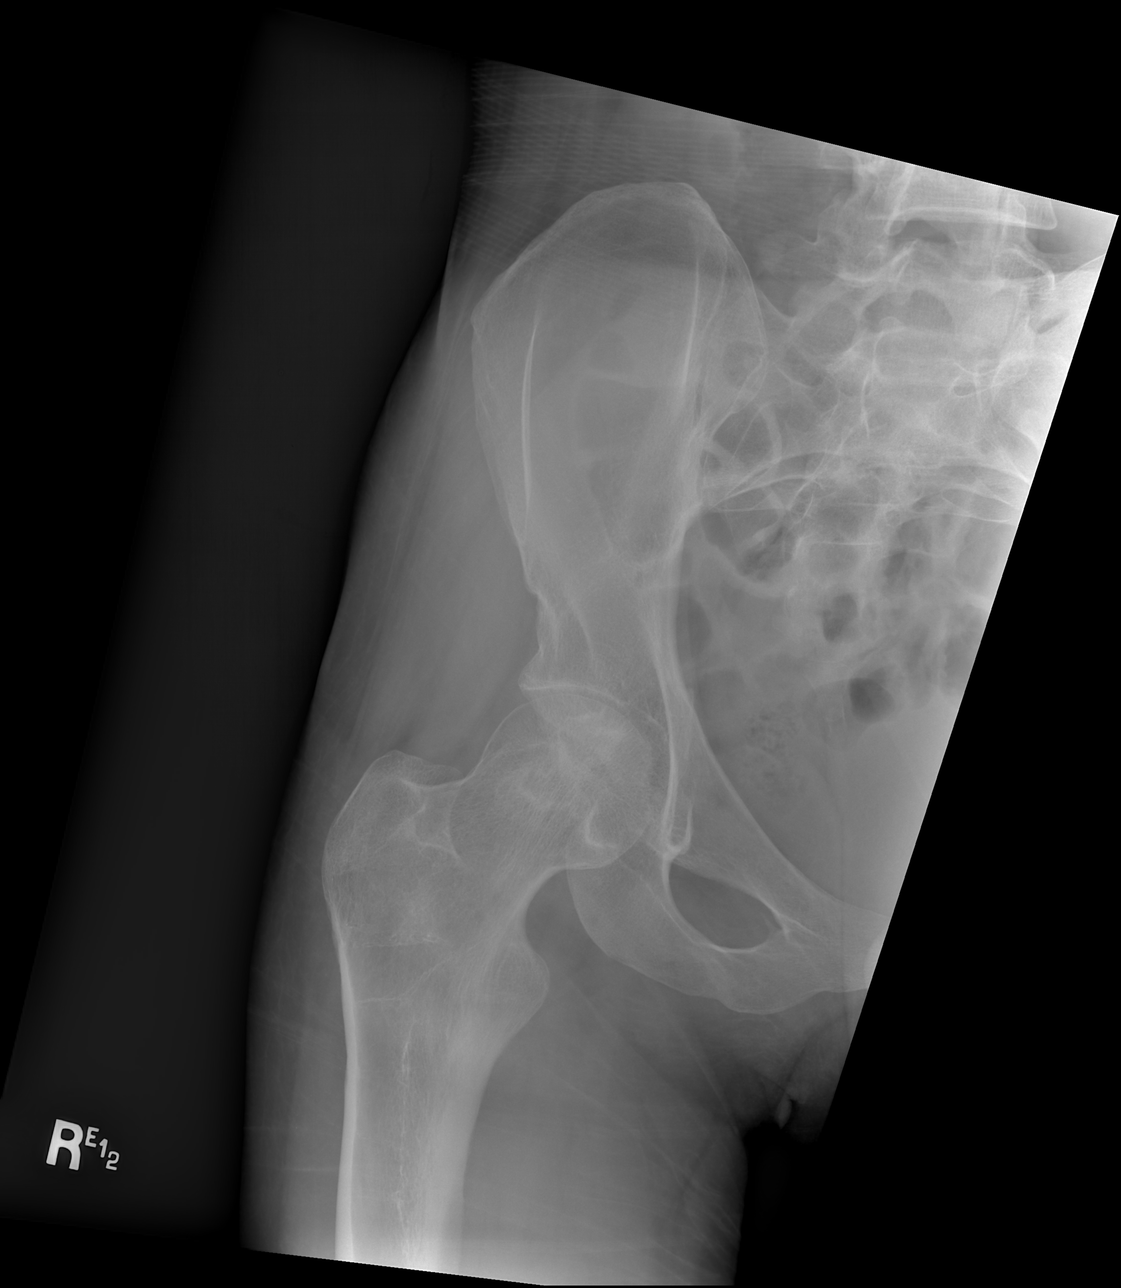

[x hip lat right]
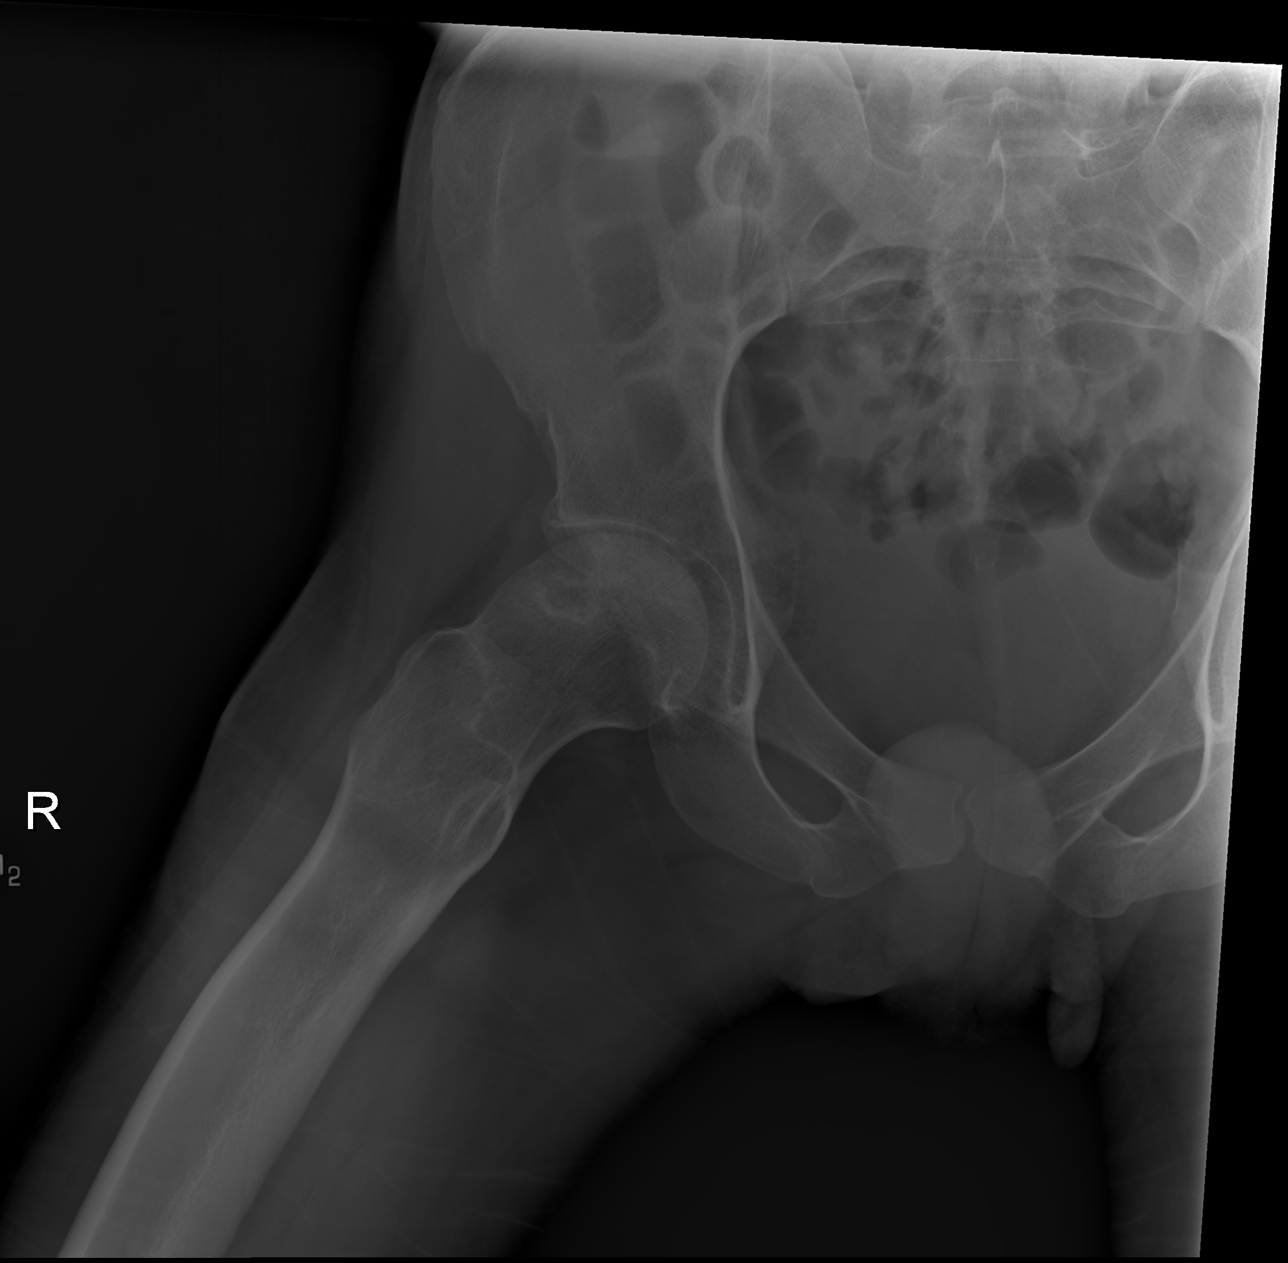

[3 of 3 positions shown; findings below may reference images not displayed]

FINDINGS: No acute fracture or dislocation. Mild joint space narrowing of both
hips. Small areas of relative lucency with peripheral sclerosis in
the bilateral femoral heads. Decreased offset at the lateral femoral
head-neck junction on the right. Bony pelvis intact. No soft tissue
abnormality.
IMPRESSION: 1. No acute osseous abnormality.
2. Mild bilateral hip joint space narrowing.
3. Areas of relative lucency with peripheral sclerosis in the
bilateral femoral heads, potentially representing sites of avascular
necrosis or possibly bone infarcts. Consider further
characterization with MRI of the pelvis, on a nonemergent basis.

## 2023-02-28 IMAGING — CR DG ANKLE COMPLETE 3+V*R*
3 series · 3 of 3 positions shown · non-contrast
Comparison: None.

CLINICAL DATA: Right leg limp

EXAM:
RIGHT ANKLE - COMPLETE 3+ VIEW; RIGHT TIBIA AND FIBULA - 2 VIEW

[x ankle ap right]
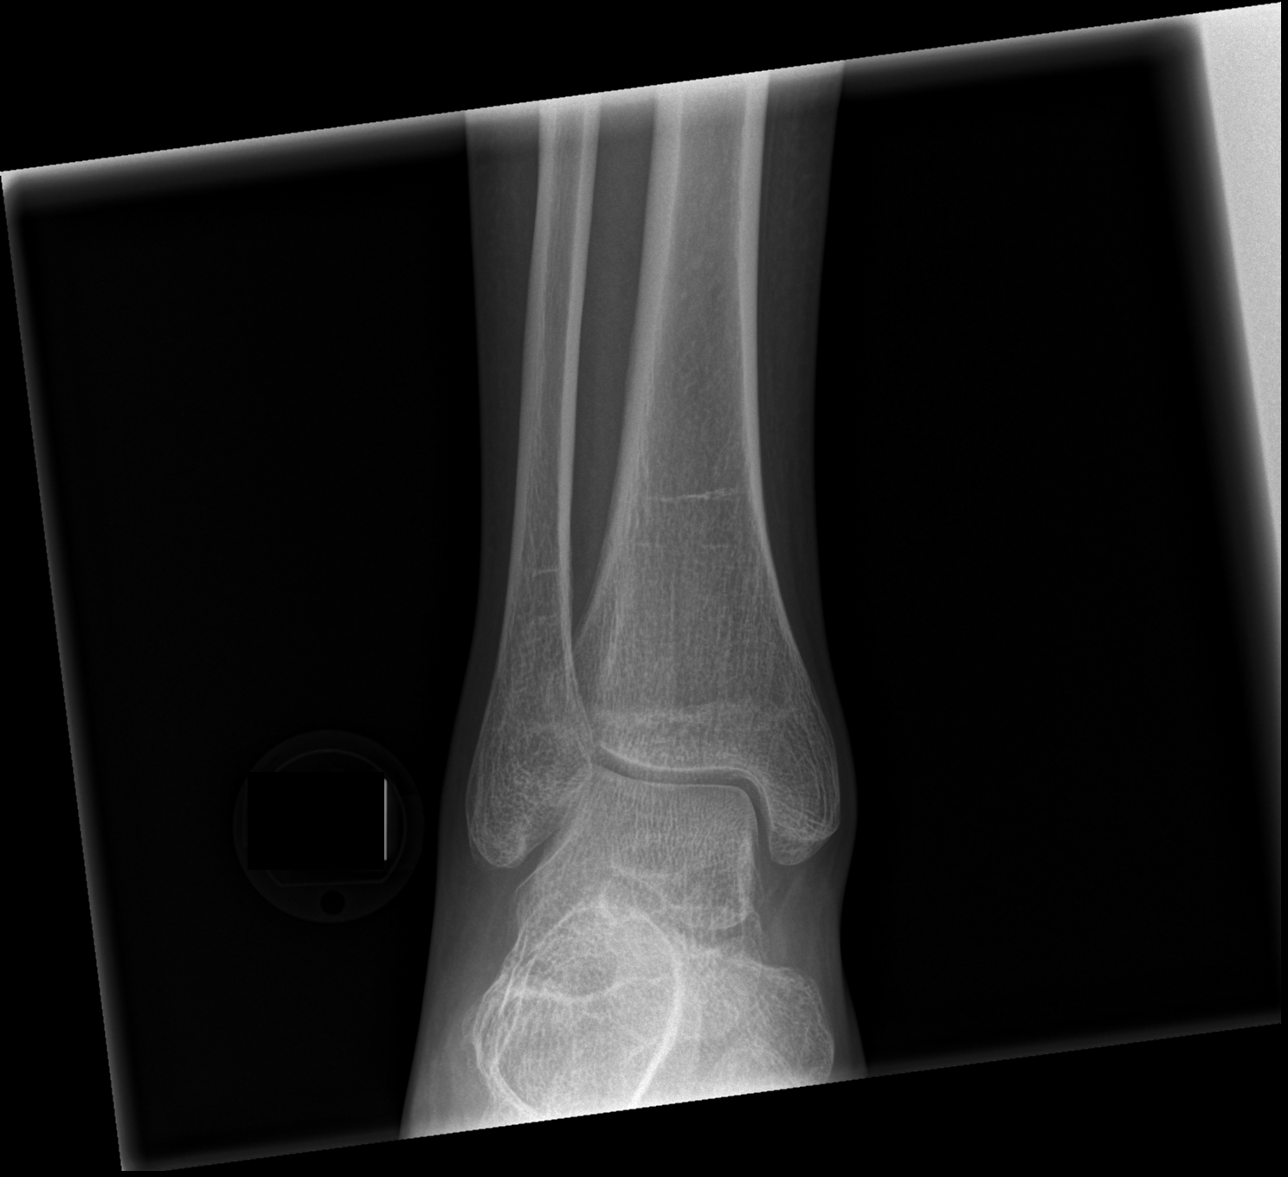

[x ankle obl right]
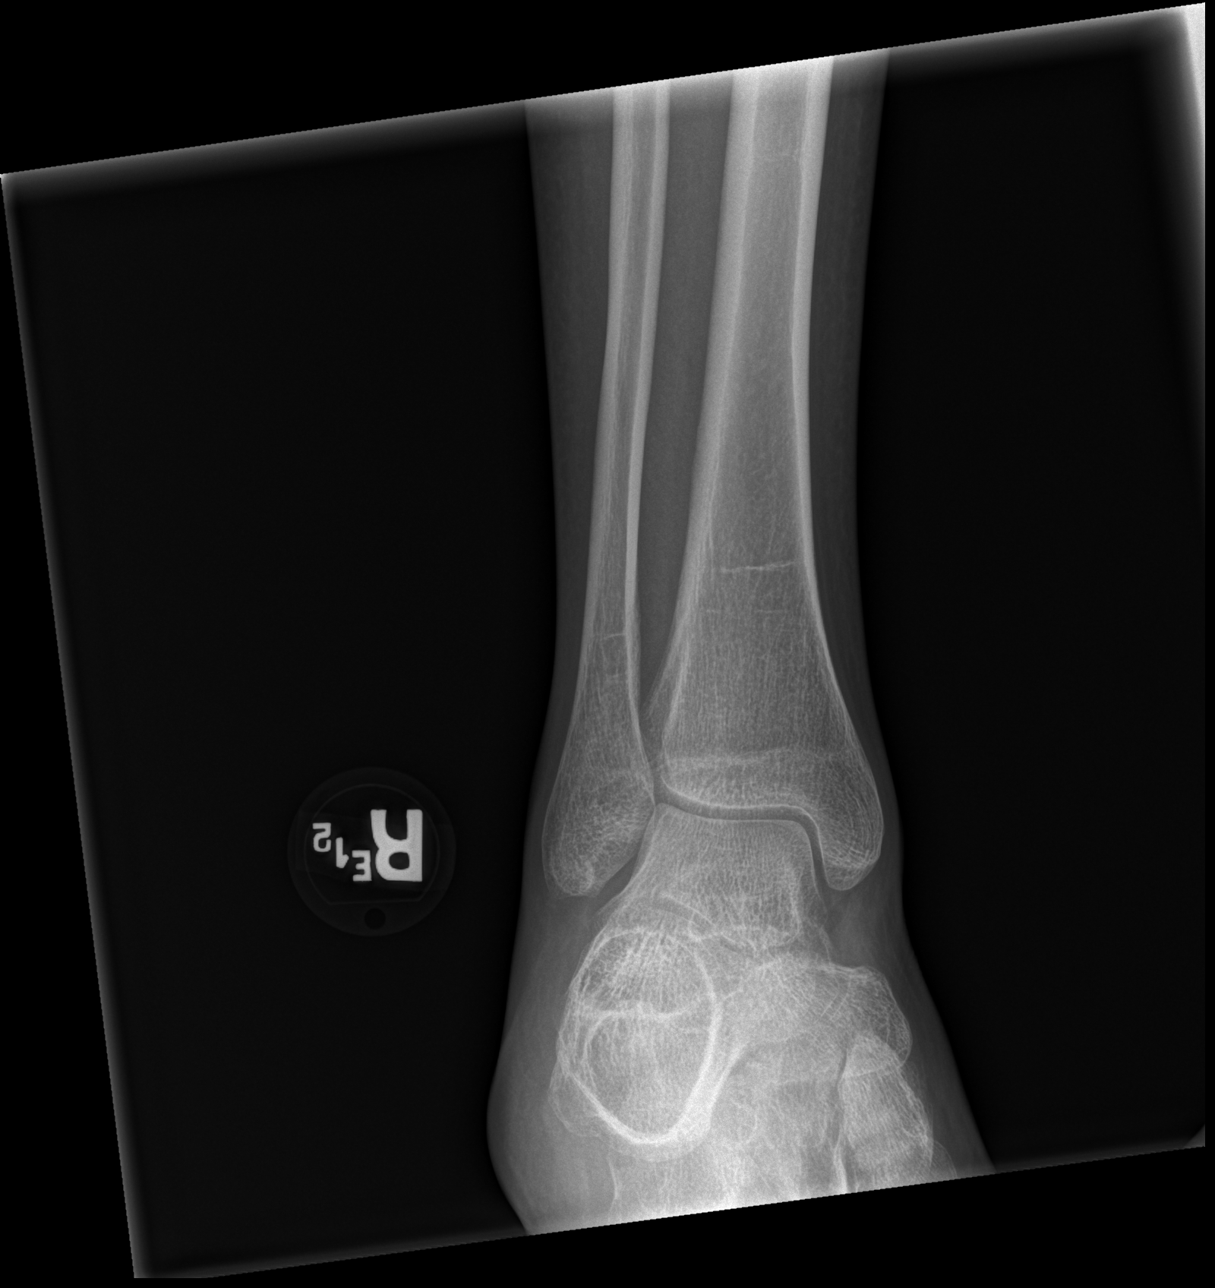

[x ankle lat right]
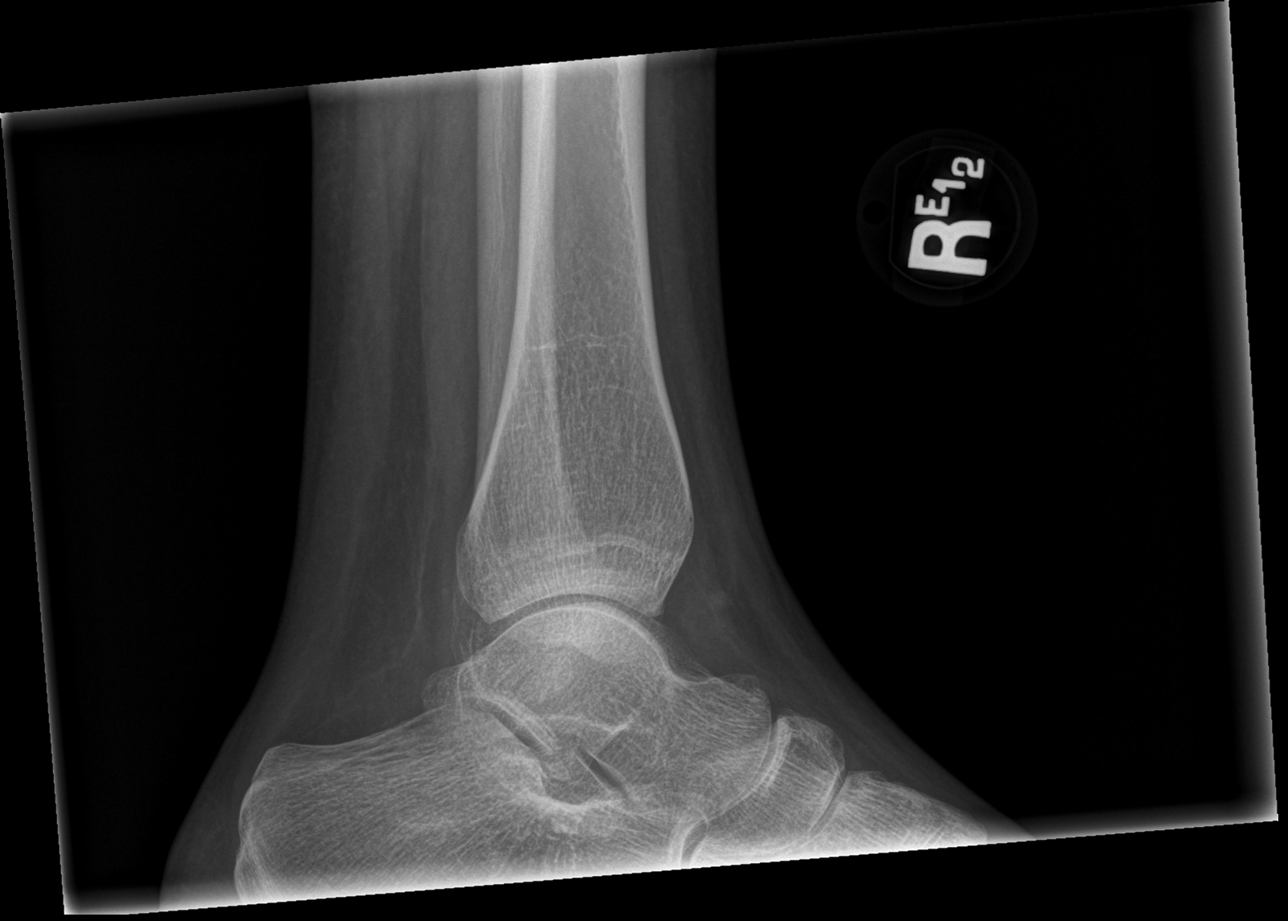

[3 of 3 positions shown; findings below may reference images not displayed]

FINDINGS: Tibia/fibula: There is no acute fracture or dislocation. Bony
alignment is normal. The soft tissues are unremarkable.

Ankle: There is no acute fracture or dislocation. Bony alignment is
normal. The ankle mortise is intact. The soft tissues are
unremarkable.
IMPRESSION: No acute fracture or dislocation of the tibia/fibula or ankle.
